# Patient Record
Sex: Female | Born: 1985 | Race: White | Hispanic: No | Marital: Married | Smoking: Never smoker
Health system: Southern US, Community
[De-identification: ages and names within clinical notes are randomized; demographics above are authoritative.]

## PROBLEM LIST (undated history)

## (undated) DIAGNOSIS — I10 Essential (primary) hypertension: Secondary | ICD-10-CM

## (undated) DIAGNOSIS — G43909 Migraine, unspecified, not intractable, without status migrainosus: Secondary | ICD-10-CM

## (undated) DIAGNOSIS — G4700 Insomnia, unspecified: Secondary | ICD-10-CM

## (undated) DIAGNOSIS — J189 Pneumonia, unspecified organism: Secondary | ICD-10-CM

## (undated) HISTORY — PX: WISDOM TOOTH EXTRACTION: SHX21

---

## 2004-12-06 ENCOUNTER — Emergency Department (HOSPITAL_COMMUNITY): Admission: EM | Admit: 2004-12-06 | Discharge: 2004-12-06 | Payer: Self-pay | Admitting: Emergency Medicine

## 2005-02-11 ENCOUNTER — Emergency Department (HOSPITAL_COMMUNITY): Admission: EM | Admit: 2005-02-11 | Discharge: 2005-02-11 | Payer: Self-pay | Admitting: Family Medicine

## 2006-05-29 ENCOUNTER — Emergency Department (HOSPITAL_COMMUNITY): Admission: EM | Admit: 2006-05-29 | Discharge: 2006-05-29 | Payer: Self-pay | Admitting: Family Medicine

## 2006-07-08 ENCOUNTER — Emergency Department (HOSPITAL_COMMUNITY): Admission: EM | Admit: 2006-07-08 | Discharge: 2006-07-08 | Payer: Self-pay | Admitting: Emergency Medicine

## 2006-09-30 ENCOUNTER — Emergency Department (HOSPITAL_COMMUNITY): Admission: EM | Admit: 2006-09-30 | Discharge: 2006-09-30 | Payer: Self-pay | Admitting: Emergency Medicine

## 2006-10-02 ENCOUNTER — Emergency Department (HOSPITAL_COMMUNITY): Admission: EM | Admit: 2006-10-02 | Discharge: 2006-10-02 | Payer: Self-pay | Admitting: Emergency Medicine
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
## 2015-06-24 ENCOUNTER — Ambulatory Visit
Admission: RE | Admit: 2015-06-24 | Discharge: 2015-06-24 | Disposition: A | Discharge status: Home / Self Care | Payer: Managed Care, Other (non HMO) | Source: Ambulatory Visit | Attending: Obstetrics and Gynecology | Admitting: Obstetrics and Gynecology

## 2015-06-24 ENCOUNTER — Other Ambulatory Visit: Payer: Self-pay | Admitting: Obstetrics and Gynecology

## 2015-06-24 DIAGNOSIS — N9489 Other specified conditions associated with female genital organs and menstrual cycle: Secondary | ICD-10-CM | POA: Diagnosis not present

## 2015-06-24 DIAGNOSIS — R102 Pelvic and perineal pain: Secondary | ICD-10-CM | POA: Insufficient documentation

## 2015-06-25 NOTE — H&P (Signed)
Chief Complaint:    Mikayla Zamora is a 29 y.o. female here for Pre Op Consulting .  Presents as followup for acute pelvic pain on left side that started 2 days ago with bright red vaginal bleeding. LMP 06/16/15 normal but with longer brown spotting. No nausea, vomiting. No diarrhea, constipation. Intermittently painful, always low level pain x2 days, cometimes much worse. Controlled with vicodin x1overnight, and motrin the rest of the time  Sexually active but not using contraception.   Ultrasound yesterday with complex adnexal mass measuring 7cm and large hypoechoic simple cyst measuring 9cm. Small complex free fluid in pelvis.   Present with husband Mikayla Zamora today.  Normal periods, not heavy, no hx of endometriosis-type sx. No prior pelvic surgeries.  Past Medical History:  has a past medical history of Abnormal uterine bleeding; Fracture of left foot; Hyperlipidemia; Hypertension; Plantar fibromatosis; and Pneumonia.  Past Surgical History:  has a past surgical history that includes Wisdom teeth removal. Family History: family history includes Arthritis in her other; Asthma in her other; Breast cancer in her maternal grandmother; Colon cancer in her paternal grandfather; Diabetes type II in her other; Hypertension in her father. Social History:  reports that she has never smoked. She does not have any smokeless tobacco history on file. She reports that she drinks alcohol. She reports that she does not use illicit drugs. OB/GYN History:  OB History    Gravida Para Term Preterm AB TAB SAB Ectopic Multiple Living   0 0 0 0 0 0 0 0 0 0       Allergies: has No Known Allergies. Medications:  Current Outpatient Prescriptions:  .  cholecalciferol, vitamin D3, 1,000 unit Chew, Take 2 tablets by mouth once daily., Disp: , Rfl:  .  diphenhydrAMINE (BENADRYL) 25 mg capsule, Take 50 mg by mouth nightly as needed for Itching or Allergies.  , Disp: , Rfl:  .  omeprazole (PRILOSEC) 20 MG DR  capsule, Take 1 capsule (20 mg total) by mouth once daily., Disp: 90 capsule, Rfl: 3 .  oxyCODONE-acetaminophen (PERCOCET) 5-325 mg tablet, Take 1 tablet by mouth every 6 (six) hours as needed for Pain., Disp: 30 tablet, Rfl: 0 .  PNV62/FA/OM3/DHA/EPA/FISH OIL (PRENATAL GUMMY ORAL), Take by mouth once daily., Disp: , Rfl:  .  propranolol (INDERAL LA) 120 MG 24 hr capsule, Take 1 capsule (120 mg total) by mouth once daily., Disp: 30 capsule, Rfl: 11 .  traZODone (DESYREL) 100 MG tablet, TAKE ONE TABLET BY MOUTH NIGHTLY AT BEDTIME, Disp: 90 tablet, Rfl: 1 .  traZODone (DESYREL) 50 MG tablet, Take 1 tablet (50 mg total) by mouth nightly as needed for Sleep. (Patient not taking: Reported on 06/24/2015 ), Disp: 60 tablet, Rfl: 11   Review of Systems: General:   No fatigue or weight loss Eyes:   No vision changes Ears:   No hearing difficulty Respiratory:                No cough or shortness of breath Pulmonary:   No asthma or shortness of breath Cardiovascular:        No chest pain, palpitations, dyspnea on exertion Gastrointestinal:          No abdominal bloating, chronic diarrhea, constipations, masses, pain or hematochezia Genitourinary:  No hematuria, dysuria, abnormal vaginal discharge, pelvic pain, Menometrorrhagia  Lymphatic:  No swollen lymph nodes Musculoskeletal: No muscle weakness Neurologic:  No extremity weakness, syncope, seizure disorder Psychiatric:  No history of depression, delusions or suicidal/homicidal ideation  Exam:   Vitals:   06/25/15 1733  BP: (!) 124/92  Pulse: 72   Body mass index is 39.51 kg/(m^2).  Exam mildly limited by habitus WDWN white female in NAD Lungs: CTA  CV : RRR without murmur   Breast: deferred Neck:  no thyromegaly Abdomen: soft , no mass, normal active bowel sounds,  non-tender, no rebound tenderness - no acute abdomen Skin: No rashes, ulcers or skin lesions noted. No excessive hirsutism or acne noted.  Neurological: Appears alert and  oriented and is a good historian. No gross abnormalities are noted. Psychological: Normal affect and mood. No signs of anxiety or depression noted.   Pelvic:   External genitalia: vulva /labia no lesions, Tanner stage 5  Urethra: no prolapse  Vagina: normal physiologic d/c  Cervix: no lesions, mild cervical motion tenderness    Uterus: normal size shape and contour, mildly-tender  Adnexa: no mass,  right non tender, significant left sided tendernss  Rectovaginal: external exam normal    Impression:   The primary encounter diagnosis was Adnexal mass. A diagnosis of Pelvic pain in female was also pertinent to this visit.    Plan:   - Left adnexal mass: Tumor markers today, including CA125, LDH, AFP. Likelihood of ovarian cancer low. Most likely intermittent torsion of large left simple cyst with possible hemorrhagic cyst. Pregnancy unlikely with neg UPT and neg beta.   She is stable but tender, and with known large cyst and unknown complex mass plan for surgical exploration.  Will plan for pelvic washings and pictures. All other procedures as indicated.  - Patient returns for a preoperative discussion regarding her plans to proceed with surgical treatment of her left adnexal pain by dx lap with pelvic washings procedure.  The patient and I discussed the technical aspects of the procedure including the potential for risks and complications.  These include but are not limited to the risk of infection requiring post-operative antibiotics or further procedures.  We talked about the risk of injury to adjacent organs including bladder, bowel, ureter, blood vessels or nerves.  We talked about the need to convert to an open incision.  We talked about the possible need for blood transfusion.  We talked about postop complications such as thromboembolic or cardiopulmonary complications.  All of her questions were answered.  Her preoperative exam was completed and the appropriate consents were signed.  She is scheduled to undergo this procedure in the near future.    Orders Placed This Encounter  Procedures  . CA 125, Serum (Serial) - [facility]  . AFP, Serum, Tumor Marker - [facility]  . LDH - [facility]    Prescott Truex EVANGELINE Zaiyden Strozier, MD   30 min spent with the patient, with >50% spent in counseling and clinical decision making

## 2015-06-26 ENCOUNTER — Ambulatory Visit: Payer: Managed Care, Other (non HMO) | Admitting: *Deleted

## 2015-06-26 ENCOUNTER — Ambulatory Visit
Admission: RE | Admit: 2015-06-26 | Discharge: 2015-06-26 | Disposition: A | Discharge status: Home / Self Care | Payer: Managed Care, Other (non HMO) | Source: Ambulatory Visit | Attending: Obstetrics and Gynecology | Admitting: Obstetrics and Gynecology

## 2015-06-26 ENCOUNTER — Encounter
Admission: RE | Disposition: A | Discharge status: Home / Self Care | Payer: Self-pay | Source: Ambulatory Visit | Attending: Obstetrics and Gynecology

## 2015-06-26 ENCOUNTER — Encounter: Payer: Self-pay | Admitting: *Deleted

## 2015-06-26 DIAGNOSIS — E785 Hyperlipidemia, unspecified: Secondary | ICD-10-CM | POA: Diagnosis not present

## 2015-06-26 DIAGNOSIS — N939 Abnormal uterine and vaginal bleeding, unspecified: Secondary | ICD-10-CM | POA: Diagnosis present

## 2015-06-26 DIAGNOSIS — I10 Essential (primary) hypertension: Secondary | ICD-10-CM | POA: Insufficient documentation

## 2015-06-26 DIAGNOSIS — R102 Pelvic and perineal pain: Secondary | ICD-10-CM | POA: Diagnosis not present

## 2015-06-26 DIAGNOSIS — Z803 Family history of malignant neoplasm of breast: Secondary | ICD-10-CM | POA: Insufficient documentation

## 2015-06-26 DIAGNOSIS — Z8249 Family history of ischemic heart disease and other diseases of the circulatory system: Secondary | ICD-10-CM | POA: Insufficient documentation

## 2015-06-26 DIAGNOSIS — Z8261 Family history of arthritis: Secondary | ICD-10-CM | POA: Insufficient documentation

## 2015-06-26 DIAGNOSIS — Z825 Family history of asthma and other chronic lower respiratory diseases: Secondary | ICD-10-CM | POA: Diagnosis not present

## 2015-06-26 DIAGNOSIS — K660 Peritoneal adhesions (postprocedural) (postinfection): Secondary | ICD-10-CM | POA: Insufficient documentation

## 2015-06-26 DIAGNOSIS — N809 Endometriosis, unspecified: Secondary | ICD-10-CM | POA: Diagnosis not present

## 2015-06-26 DIAGNOSIS — Z800 Family history of malignant neoplasm of digestive organs: Secondary | ICD-10-CM | POA: Diagnosis not present

## 2015-06-26 DIAGNOSIS — N838 Other noninflammatory disorders of ovary, fallopian tube and broad ligament: Secondary | ICD-10-CM | POA: Insufficient documentation

## 2015-06-26 DIAGNOSIS — K661 Hemoperitoneum: Secondary | ICD-10-CM | POA: Insufficient documentation

## 2015-06-26 DIAGNOSIS — Z833 Family history of diabetes mellitus: Secondary | ICD-10-CM | POA: Insufficient documentation

## 2015-06-26 DIAGNOSIS — Z79899 Other long term (current) drug therapy: Secondary | ICD-10-CM | POA: Diagnosis not present

## 2015-06-26 HISTORY — DX: Insomnia, unspecified: G47.00

## 2015-06-26 HISTORY — DX: Migraine, unspecified, not intractable, without status migrainosus: G43.909

## 2015-06-26 HISTORY — DX: Pneumonia, unspecified organism: J18.9

## 2015-06-26 HISTORY — PX: LAPAROSCOPY: SHX197

## 2015-06-26 HISTORY — DX: Essential (primary) hypertension: I10

## 2015-06-26 LAB — TYPE AND SCREEN
ABO/RH(D): O POS
ANTIBODY SCREEN: NEGATIVE

## 2015-06-26 LAB — CBC
HEMATOCRIT: 41 % (ref 35.0–47.0)
HEMOGLOBIN: 13.8 g/dL (ref 12.0–16.0)
MCH: 27.3 pg (ref 26.0–34.0)
MCHC: 33.6 g/dL (ref 32.0–36.0)
MCV: 81.2 fL (ref 80.0–100.0)
Platelets: 203 10*3/uL (ref 150–440)
RBC: 5.05 MIL/uL (ref 3.80–5.20)
RDW: 14 % (ref 11.5–14.5)
WBC: 7.9 10*3/uL (ref 3.6–11.0)

## 2015-06-26 LAB — BASIC METABOLIC PANEL
ANION GAP: 6 (ref 5–15)
BUN: 15 mg/dL (ref 6–20)
CHLORIDE: 106 mmol/L (ref 101–111)
CO2: 28 mmol/L (ref 22–32)
Calcium: 9.4 mg/dL (ref 8.9–10.3)
Creatinine, Ser: 0.81 mg/dL (ref 0.44–1.00)
GFR calc non Af Amer: >60 mL/min (ref >60)
Glucose, Bld: 100 mg/dL — ABNORMAL HIGH (ref 65–99)
POTASSIUM: 3.9 mmol/L (ref 3.5–5.1)
Sodium: 140 mmol/L (ref 135–145)

## 2015-06-26 LAB — PREGNANCY, URINE: Preg Test, Ur: NEGATIVE

## 2015-06-26 LAB — ABO/RH: ABO/RH(D): O POS

## 2015-06-26 SURGERY — LAPAROSCOPY, DIAGNOSTIC
Anesthesia: General | Wound class: Clean Contaminated

## 2015-06-26 MED ORDER — DEXAMETHASONE SODIUM PHOSPHATE 10 MG/ML IJ SOLN
INTRAMUSCULAR | Status: DC | PRN
  Administered 2015-06-26: 10 mg via INTRAVENOUS

## 2015-06-26 MED ORDER — HYDROCODONE-ACETAMINOPHEN 5-325 MG PO TABS: 1.0000 | ORAL_TABLET | ORAL | Status: DC | PRN

## 2015-06-26 MED ORDER — ROCURONIUM BROMIDE 100 MG/10ML IV SOLN
INTRAVENOUS | Status: DC | PRN
  Administered 2015-06-26: 20 mg via INTRAVENOUS
  Administered 2015-06-26 (×3): 10 mg via INTRAVENOUS

## 2015-06-26 MED ORDER — HYDROCODONE-ACETAMINOPHEN 5-325 MG PO TABS: 1.0000 | ORAL_TABLET | ORAL | Status: AC | PRN

## 2015-06-26 MED ORDER — LACTATED RINGERS IV SOLN
INTRAVENOUS | Status: DC
  Administered 2015-06-26 (×2): via INTRAVENOUS

## 2015-06-26 MED ORDER — FENTANYL CITRATE (PF) 100 MCG/2ML IJ SOLN
INTRAMUSCULAR | Status: DC | PRN
  Administered 2015-06-26 (×2): 50 ug via INTRAVENOUS
  Administered 2015-06-26: 250 ug via INTRAVENOUS

## 2015-06-26 MED ORDER — BUPIVACAINE HCL (PF) 0.5 % IJ SOLN
INTRAMUSCULAR | Status: AC
  Filled 2015-06-26: qty 30

## 2015-06-26 MED ORDER — DOCUSATE SODIUM 100 MG PO CAPS: 100.0000 mg | ORAL_CAPSULE | Freq: Two times a day (BID) | ORAL | Status: AC | PRN

## 2015-06-26 MED ORDER — SUCCINYLCHOLINE CHLORIDE 20 MG/ML IJ SOLN
INTRAMUSCULAR | Status: DC | PRN
  Administered 2015-06-26: 100 mg via INTRAVENOUS

## 2015-06-26 MED ORDER — METHYLENE BLUE 1 % INJ SOLN
INTRAMUSCULAR | Status: AC
  Filled 2015-06-26: qty 10

## 2015-06-26 MED ORDER — ONDANSETRON HCL 4 MG/2ML IJ SOLN: 4.0000 mg | Freq: Once | INTRAMUSCULAR | Status: DC | PRN

## 2015-06-26 MED ORDER — CEFAZOLIN SODIUM 1-5 GM-% IV SOLN
INTRAVENOUS | Status: DC | PRN
  Administered 2015-06-26: 3 g via INTRAVENOUS

## 2015-06-26 MED ORDER — ONDANSETRON HCL 4 MG/2ML IJ SOLN
INTRAMUSCULAR | Status: DC | PRN
  Administered 2015-06-26: 4 mg via INTRAVENOUS

## 2015-06-26 MED ORDER — ACETAMINOPHEN 10 MG/ML IV SOLN
INTRAVENOUS | Status: DC | PRN
Start: 2015-06-26 — End: 2015-06-26
  Administered 2015-06-26: 1000 mg via INTRAVENOUS

## 2015-06-26 MED ORDER — HYDROMORPHONE HCL 1 MG/ML IJ SOLN
INTRAMUSCULAR | Status: AC
  Administered 2015-06-26: 0.5 mg via INTRAVENOUS
  Filled 2015-06-26: qty 1

## 2015-06-26 MED ORDER — HYDROMORPHONE HCL 1 MG/ML IJ SOLN
0.2500 mg | INTRAMUSCULAR | Status: DC | PRN
  Administered 2015-06-26 (×2): 0.5 mg via INTRAVENOUS

## 2015-06-26 MED ORDER — KETOROLAC TROMETHAMINE 30 MG/ML IJ SOLN: 30.0000 mg | Freq: Once | INTRAMUSCULAR | Status: DC

## 2015-06-26 MED ORDER — LIDOCAINE HCL (CARDIAC) 20 MG/ML IV SOLN
INTRAVENOUS | Status: DC | PRN
  Administered 2015-06-26: 100 mg via INTRAVENOUS

## 2015-06-26 MED ORDER — IBUPROFEN 800 MG PO TABS: 800.0000 mg | ORAL_TABLET | Freq: Three times a day (TID) | ORAL | Status: AC | PRN

## 2015-06-26 MED ORDER — PROPOFOL 10 MG/ML IV BOLUS
INTRAVENOUS | Status: DC | PRN
  Administered 2015-06-26: 200 mg via INTRAVENOUS
  Administered 2015-06-26: 50 mg via INTRAVENOUS

## 2015-06-26 MED ORDER — ONDANSETRON 4 MG PO TBDP
4.0000 mg | ORAL_TABLET | Freq: Four times a day (QID) | ORAL | Status: AC | PRN
Start: 2015-06-26 — End: ?

## 2015-06-26 MED ORDER — NEOSTIGMINE METHYLSULFATE 10 MG/10ML IV SOLN
INTRAVENOUS | Status: DC | PRN
  Administered 2015-06-26: 4 mg via INTRAVENOUS

## 2015-06-26 MED ORDER — BUPIVACAINE HCL 0.5 % IJ SOLN
INTRAMUSCULAR | Status: DC | PRN
  Administered 2015-06-26: 30 mL

## 2015-06-26 MED ORDER — ACETAMINOPHEN 10 MG/ML IV SOLN
INTRAVENOUS | Status: AC
  Filled 2015-06-26: qty 100

## 2015-06-26 MED ORDER — MIDAZOLAM HCL 2 MG/2ML IJ SOLN
INTRAMUSCULAR | Status: DC | PRN
  Administered 2015-06-26: 2 mg via INTRAVENOUS

## 2015-06-26 MED ORDER — DEXMEDETOMIDINE HCL 200 MCG/2ML IV SOLN
INTRAVENOUS | Status: DC | PRN
  Administered 2015-06-26: 8 ug via INTRAVENOUS

## 2015-06-26 MED ORDER — GLYCOPYRROLATE 0.2 MG/ML IJ SOLN
INTRAMUSCULAR | Status: DC | PRN
  Administered 2015-06-26: 0.6 mg via INTRAVENOUS

## 2015-06-26 SURGICAL SUPPLY — 44 items
BAG URO DRAIN 2000ML W/SPOUT (MISCELLANEOUS) ×1 IMPLANT
BLADE SURG SZ11 CARB STEEL (BLADE) ×2 IMPLANT
CATH FOLEY 2WAY  5CC 16FR (CATHETERS) ×1
CATH FOLEY 2WAY 5CC 16FR (CATHETERS) ×1
CATH ROBINSON RED A/P 16FR (CATHETERS) ×2 IMPLANT
CATH URTH 16FR FL 2W BLN LF (CATHETERS) ×1 IMPLANT
CHLORAPREP W/TINT 26ML (MISCELLANEOUS) ×2 IMPLANT
DEFOGGER SCOPE WARMER CLEARIFY (MISCELLANEOUS) ×1 IMPLANT
DRSG TEGADERM 2-3/8X2-3/4 SM (GAUZE/BANDAGES/DRESSINGS) ×2 IMPLANT
ENDOPOUCH RETRIEVER 10 (MISCELLANEOUS) ×1 IMPLANT
GAUZE SPONGE NON-WVN 2X2 STRL (MISCELLANEOUS) ×1 IMPLANT
GLOVE BIO SURGEON STRL SZ 6.5 (GLOVE) ×4 IMPLANT
GLOVE INDICATOR 7.0 STRL GRN (GLOVE) ×2 IMPLANT
GOWN STRL REUS W/ TWL LRG LVL3 (GOWN DISPOSABLE) ×2 IMPLANT
GOWN STRL REUS W/TWL LRG LVL3 (GOWN DISPOSABLE) ×4
GRASPER SUT TROCAR 14GX15 (MISCELLANEOUS) ×1 IMPLANT
IRRIGATION STRYKERFLOW (MISCELLANEOUS) ×1 IMPLANT
IRRIGATOR STRYKERFLOW (MISCELLANEOUS) ×2
IV SOD CHL 0.9% 1000ML (IV SOLUTION) ×2 IMPLANT
KIT RM TURNOVER CYSTO AR (KITS) ×2 IMPLANT
LABEL OR SOLS (LABEL) ×2 IMPLANT
LIGASURE MARYLAND LAP STAND (ELECTROSURGICAL) ×1 IMPLANT
LIQUID BAND (GAUZE/BANDAGES/DRESSINGS) ×2 IMPLANT
NEEDLE INSUFFLATION 150MM (ENDOMECHANICALS) ×1 IMPLANT
NS IRRIG 500ML POUR BTL (IV SOLUTION) ×2 IMPLANT
PACK GYN LAPAROSCOPIC (MISCELLANEOUS) ×2 IMPLANT
PAD OB MATERNITY 4.3X12.25 (PERSONAL CARE ITEMS) ×2 IMPLANT
PAD PREP 24X41 OB/GYN DISP (PERSONAL CARE ITEMS) ×2 IMPLANT
PENCIL ELECTRO HAND CTR (MISCELLANEOUS) ×1 IMPLANT
SCISSORS METZENBAUM CVD 33 (INSTRUMENTS) ×3 IMPLANT
SHEARS HARMONIC ACE PLUS 36CM (ENDOMECHANICALS) ×1 IMPLANT
SLEEVE ENDOPATH XCEL 5M (ENDOMECHANICALS) ×3 IMPLANT
SPONGE VERSALON 2X2 STRL (MISCELLANEOUS) ×2
STRIP CLOSURE SKIN 1/4X4 (GAUZE/BANDAGES/DRESSINGS) ×2 IMPLANT
SUT MNCRL AB 4-0 PS2 18 (SUTURE) ×3 IMPLANT
SUT VIC AB 0 UR5 27 (SUTURE) ×1 IMPLANT
SUT VIC AB 2-0 UR6 27 (SUTURE) ×2 IMPLANT
SUT VIC AB 4-0 SH 27 (SUTURE) ×2
SUT VIC AB 4-0 SH 27XANBCTRL (SUTURE) ×1 IMPLANT
SWABSTK COMLB BENZOIN TINCTURE (MISCELLANEOUS) ×2 IMPLANT
TROCAR ENDO BLADELESS 11MM (ENDOMECHANICALS) ×2 IMPLANT
TROCAR XCEL NON-BLD 5MMX100MML (ENDOMECHANICALS) ×4 IMPLANT
TROCAR XCEL UNIV SLVE 11M 100M (ENDOMECHANICALS) ×2 IMPLANT
TUBING INSUFFLATOR HI FLOW (MISCELLANEOUS) ×2 IMPLANT

## 2015-06-26 NOTE — Op Note (Addendum)
[facility] PROCEDURE DATE: 06/26/2015  PREOPERATIVE DIAGNOSIS: Acute pelvic pain, left adnexal mass, heavy abnormal uterine bleeding POSTOPERATIVE DIAGNOSIS:  left paratubal cyst, hemoperitoneum, pelvic adhesions PROCEDURE: Diagnostic laparoscopy, Laparoscopic left salpingectomy and removal of left-sided paratubal cyst, evacuation of hemoperitoneum, adhesiolysis  SURGEON:  Dr. Christeen Douglas ASSISTANT: Dr. Maryann Conners  ANESTHESIOLOGIST: Linward Natal, MD Anesthesiologist: Linward Natal, MD CRNA: Junious Silk, CRNA; Lily Kocher, CRNA; Paulette Blanch, CRNA  INDICATIONS: 29 y.o. G0 here with the preoperative diagnoses as listed above.  Please refer to preoperative notes for more details. Patient was counseled regarding need for laparoscopic salpingectomy. Risks of surgery including bleeding which may require transfusion or reoperation, infection, injury to bowel or other surrounding organs, need for additional procedures including laparotomy and other postoperative/anesthesia complications were explained to patient.  Written informed consent was obtained.  FINDINGS:  Normal proliferative endometrial lining, with both ostia visualized. No lesions or masses; no septa noted.Total amount of hemoperitoneum estimated to be about 300 of blood and clots.  Left large simple paratubal cyst surrounded by significant amounts of organized clot with large amount of hemoperitoneum, but no active bleeding found. The cyst, which was in the fimbria, was unable to be separated from the fallopian tube. Left pelvic adhesions between fallopian tube/cyst complex and left rectosigmoid colon. Small normal appearing uterus, normal right fallopian tube, right ovary and left ovary.  ANESTHESIA: General INTRAVENOUS FLUIDS: 1700 ml ESTIMATED BLOOD LOSS: 50 ml URINE OUTPUT: 200 ml SPECIMENS: Left fallopian tube, left cyst, pelvic washings and endometrial biopsy COMPLICATIONS: None immediate  PROCEDURE IN DETAIL:  The patient was  taken to the operating room where general anesthesia was administered and was found to be adequate.  She was placed in the dorsal lithotomy position, and was prepped and draped in a sterile manner.   D&C:: Her bladder was catheterized for an estimated amount of clear, yellow urine. A weighed speculum was then placed in the patient's vagina and a single tooth tenaculum was applied to the anterior lip of the cervix.  Her cervix was serially dilated to 15 [place] using Hanks dilators. A hysteroscopy was placed with findings above. A sharp curettage was introducedand gentle biopsy taken. The tenaculum was removed from the anterior lip of the cervix and the vaginal speculum was removed after applying silver nitrate for good hemostasis. A sponge stick was placed in the vagina for uterine manipulation.  Laparoscopy:: Attention was turned to the abdomen where an umbilical incision was made with the scalpel.  A veress needle was placed and the abdomen was insufflated to . The Optiview 5-mm trocar and sleeve were then advanced without difficulty. Survey of the entry site was noted to be without damage. A survey of the patient's pelvis and abdomen revealed the findings above.  A 11-mm port was placed on the right under direct visualization, and a 5-mm right lower quadrant port was then placed under direct visualization.  The Nezhat suction irrigator was then used to suction the hemoperitoneum and irrigate the pelvis.  A survey of the pelvis was unable to find the ureter peristalsis secondary to colonic adhesions to the clot, but the entire course of the ureter was significantly away from the surgical field. Attention was then turned to the left fallopian tube and ovary. The left tube was separated from the underlying mesosalpinx and uterine attachment using the Ligasure instrument.  Good hemostasis was noted.  The specimen was placed in an EndoCatch bag and removed from the abdomen intact.  The abdomen was  desufflated, and all instruments were removed.  The fascial incision of the 10-mm site was reapproximated with a 0 Vicryl figure-of-eight stitch; and all skin incisions were closed with 4-0 Vicryl and Dermabond.   The patient tolerated the procedure well.  All instruments, needles, and sponge counts were correct x 2. The patient was taken to the recovery room in stable condition.   Cline CoolsBethany E. Juanna Pudlo, MD, MPH

## 2015-06-26 NOTE — Discharge Instructions (Signed)
Laparoscopic Tubal Removal  Discharge Instructions  Laparoscopic tubal removal is a procedure that removes the fallopian tube containing the cyst  RISKS AND COMPLICATIONS   Infection.  Bleeding.  Injury to surrounding organs.  Anesthetic side effects.  Failure of the procedure.  Risks of future ectopic pregnancy on the other side PROCEDURE   You may be given a medicine to help you relax (sedative) before the procedure. You will be given a medicine to make you sleep (general anesthetic) during the procedure.  A tube will be put down your throat to help your breath while under general anesthesia.  Two small cuts (incisions) are made in the lower abdominal area and one incision is made near the belly button.  Your abdominal area will be inflated with a safe gas (carbon dioxide). This helps give the surgeon room to operate, visualize, and helps the surgeon avoid other organs.  A thin, lighted tube (laparoscope) with a camera attached is inserted into your abdomen through the incision near the belly button. Other small instruments are also inserted through the other abdominal incisions.  The fallopian tube is located and are removed.  After the fallopian tube is removed, the gas is released from the abdomen.  The incisions will be closed with stitches (sutures), and Dermabond. A bandage may be placed over the incisions. AFTER THE PROCEDURE   You will also have some mild abdominal discomfort for 3-7 days. You will be given pain medicine to ease any discomfort.  As long as there are no problems, you may be allowed to go home. Someone will need to drive you home and be with you for at least 24 hours once home.  You may have some mild discomfort in the throat. This is from the tube placed in your throat while you were sleeping.  You may experience discomfort in the shoulder area from some trapped air between the liver and diaphragm. This sensation is normal and will slowly go away on  its own. HOME CARE INSTRUCTIONS   Take all medicines as directed.  Only take over-the-counter or prescription medicines for pain, discomfort, or fever as directed by your caregiver.  Resume daily activities as directed.  Showers are preferred over baths.  You may resume sexual activities in 2 weeks or when you stop spotting  Do not drive while taking narcotics. SEEK MEDICAL CARE IF: .  There is increasing abdominal pain.  You feel lightheaded or faint.  You have the chills.  You have an oral temperature above 102 F (38.9 C).  There is pus-like (purulent) drainage from any of the wounds.  You are unable to pass gas or have a bowel movement.  You feel sick to your stomach (nauseous) or throw up (vomit). MAKE SURE YOU:   Understand these instructions.  Will watch your condition.  Will get help right away if you are not doing well or get worse.  ExitCare Patient Information 2013 [employer] [place]    AMBULATORY SURGERY  DISCHARGE INSTRUCTIONS   1) The drugs that you were given will stay in your system until tomorrow so for the next 24 hours you should not:  A) Drive an automobile B) Make any legal decisions C) Drink any alcoholic beverage   2) You may resume regular meals tomorrow.  Today it is better to start with liquids and gradually work up to solid foods.  You may eat anything you prefer, but it is better to start with liquids, then soup and crackers, and gradually work up  to solid foods.   3) Please notify your doctor immediately if you have any unusual bleeding, trouble breathing, redness and pain at the surgery site, drainage, fever, or pain not relieved by medication.    4) Additional Instructions:        Please contact your physician with any problems or Same Day Surgery at [phone], Monday through Friday 6 am to 4 pm, or [place] at [facility] number at [phone].

## 2015-06-26 NOTE — Transfer of Care (Signed)
Immediate Anesthesia Transfer of Care Note  Patient: Mikayla Zamora  Procedure(s) Performed: Procedure(s): LAPAROSCOPY DIAGNOSTIC  WITH PELVIC WASHINGS/HYSTEROSCOPY/LEFT SALPINGECTOMY, lysis of adhesions, evacuation of hemoperitoneum (N/A)  Patient Location: PACU  Anesthesia Type:General  Level of Consciousness: awake, alert  and oriented  Airway & Oxygen Therapy: Patient Spontanous Breathing and Patient connected to face mask oxygen  Post-op Assessment: Report given to RN and Post -op Vital signs reviewed and stable  Post vital signs: Reviewed and stable  Last Vitals:  Filed Vitals:   06/26/15 0925  BP: 139/85  Pulse: 80  Temp: 36.6 C  Resp: 16    Complications: No apparent anesthesia complications

## 2015-06-26 NOTE — Anesthesia Preprocedure Evaluation (Addendum)
Anesthesia Evaluation  Patient identified by MRN, date of birth, ID band Patient awake    Reviewed: Allergy & Precautions, NPO status , Patient's Chart, lab work & pertinent test results  Airway Mallampati: I  TM Distance: >3 FB Neck ROM: Full    Dental  (+) Teeth Intact   Pulmonary    Pulmonary exam normal        Cardiovascular Exercise Tolerance: Good (-) hypertensionNormal cardiovascular exam     Neuro/Psych  Headaches,    GI/Hepatic GERD  Medicated and Controlled,  Endo/Other    Renal/GU      Musculoskeletal   Abdominal (+) + obese,   Peds  Hematology   Anesthesia Other Findings   Reproductive/Obstetrics                            Anesthesia Physical Anesthesia Plan  ASA: II and emergent  Anesthesia Plan: General   Post-op Pain Management:    Induction: Intravenous  Airway Management Planned: Oral ETT  Additional Equipment:   Intra-op Plan:   Post-operative Plan: Extubation in OR  Informed Consent: I have reviewed the patients History and Physical, chart, labs and discussed the procedure including the risks, benefits and alternatives for the proposed anesthesia with the patient or authorized representative who has indicated his/her understanding and acceptance.     Plan Discussed with:   Anesthesia Plan Comments: (On propranolol for migraine prophylaxis. Uneventful wisdom teeth extractions. Beta-HCG negative. Three day hx of pelvic pain and bleeding.  R/O ovarian torsion.)        Anesthesia Quick Evaluation

## 2015-06-26 NOTE — Interval H&P Note (Signed)
History and Physical Interval Note:  06/26/2015 11:04 AM  Mikayla Zamora  has presented today for surgery, with the diagnosis of acute pelvic pain, vaginal bleeding, and ADNEXAL MASS, vaginal pain.  The various methods of treatment have been discussed with the patient and family. After consideration of risks, benefits and other options for treatment, the patient has consented to dilation and curettage, hysteroscopy  Procedure(s): LAPAROSCOPY DIAGNOSTIC  WITH PELVIC WASHINGS (N/A) possible salpingectomy and possible oophorectomy, as a surgical intervention .  The patient's history has been reviewed, patient examined, no change in status, stable for surgery.  I have reviewed the patient's chart and labs.  Questions were answered to the patient's satisfaction.    I originally did not plan on D&C, hysteroscopy, but given patient's increasing vaginal bleeding overnight with significant pain, I have consented her in the preop area for hysteroscopy with gentle D&C.  Christeen DouglasBEASLEY, Patton Swisher

## 2015-06-26 NOTE — Transfer of Care (Incomplete)
Immediate Anesthesia Transfer of Care Note  Patient: Mikayla Zamora  Procedure(s) Performed: Procedure(s): LAPAROSCOPY DIAGNOSTIC  WITH PELVIC WASHINGS/HYSTEROSCOPY/LEFT SALPINGECTOMY, lysis of adhesions, evacuation of hemoperitoneum (N/A)  Patient Location: {PLACES; ANE POST:19477::"PACU"}  Anesthesia Type:{PROCEDURES; ANE POST ANESTHESIA TYPE:19480}  Level of Consciousness: {FINDINGS; ANE POST LEVEL OF CONSCIOUSNESS:19484}  Airway & Oxygen Therapy: {Exam; oxygen device:30095}  Post-op Assessment: {ASSESSMENT;POST-OP XBJYNW:29562}REPORT:18741}  Post vital signs: {DESC; ANE POST ZHYQMV:78469}VITALS:19483}  Last Vitals:  Filed Vitals:   06/26/15 0925  BP: 139/85  Pulse: 80  Temp: 36.6 C  Resp: 16    Complications: {FINDINGS; ANE POST COMPLICATIONS:19485}

## 2015-06-26 NOTE — Anesthesia Postprocedure Evaluation (Signed)
Anesthesia Post Note  Patient: Jerilynn SomRebecca R Limones  Procedure(s) Performed: Procedure(s) (LRB): LAPAROSCOPY DIAGNOSTIC  WITH PELVIC WASHINGS/HYSTEROSCOPY/LEFT SALPINGECTOMY, lysis of adhesions, evacuation of hemoperitoneum (N/A)  Patient location during evaluation: PACU Anesthesia Type: General Level of consciousness: awake Pain management: pain level controlled Vital Signs Assessment: post-procedure vital signs reviewed and stable Respiratory status: spontaneous breathing Cardiovascular status: blood pressure returned to baseline Postop Assessment: no headache Anesthetic complications: no    Last Vitals:  Filed Vitals:   06/26/15 0925 06/26/15 1443  BP: 139/85 109/46  Pulse: 80 73  Temp: 36.6 C 36.4 C  Resp: 16 14    Last Pain:  Filed Vitals:   06/26/15 1444  PainSc: 4                  Jarryn Altland M

## 2015-06-29 ENCOUNTER — Encounter: Payer: Self-pay | Admitting: Obstetrics and Gynecology

## 2015-06-30 LAB — CYTOLOGY - NON PAP

## 2015-07-02 LAB — SURGICAL PATHOLOGY

## 2015-07-20 NOTE — Op Note (Signed)
Operative note Addendum:  In the original op note for this procedure, I neglected to mention that I did perform and hysteroscopy with dilation and curettage prior to the dx laparoscopy part of the procedure.

## 2017-02-25 IMAGING — US US PELVIS COMPLETE
1 series · 13 of 25 positions shown · non-contrast
Comparison: None

CLINICAL DATA: Acute pelvic pain for 1 day. Pain on the left side.
Vaginal bleeding.

EXAM:
TRANSABDOMINAL AND TRANSVAGINAL ULTRASOUND OF PELVIS
TECHNIQUE: Both transabdominal and transvaginal ultrasound examinations of the
pelvis were performed. Transabdominal technique was performed for
global imaging of the pelvis including uterus, ovaries, adnexal
regions, and pelvic cul-de-sac. It was necessary to proceed with
endovaginal exam following the transabdominal exam to visualize the
endometrium and ovaries..

[Series 1: us pelvis complete · 0.21mm/px · 13 of 121 slices shown]
[im 1/121]
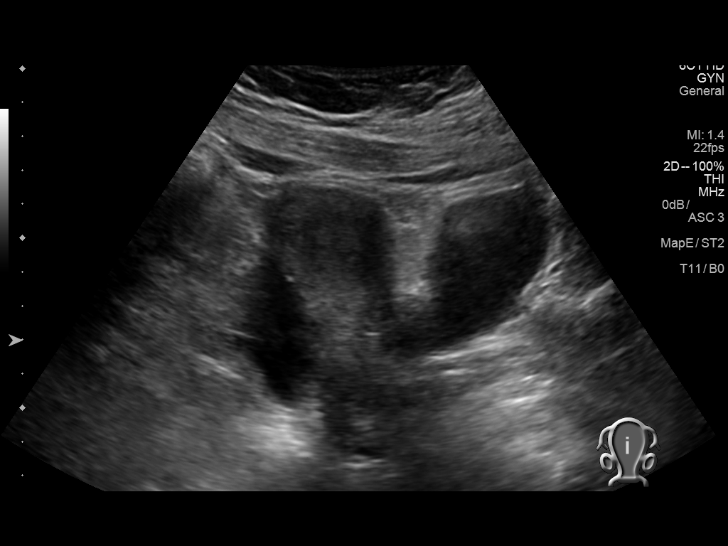
[im 11/121]
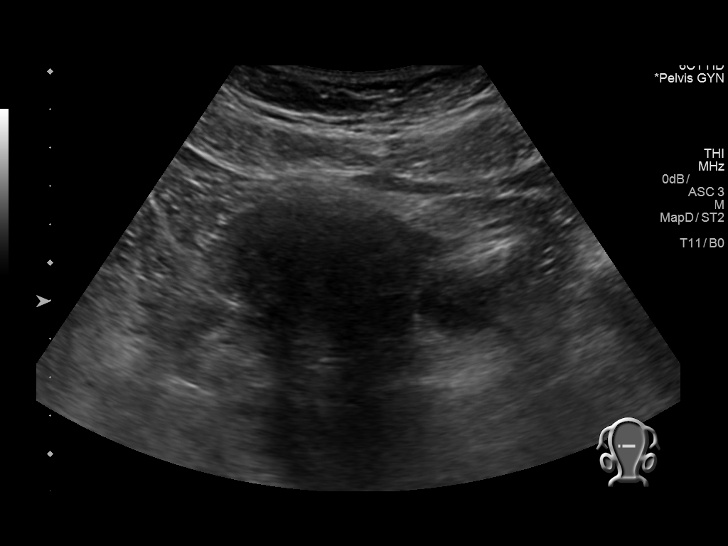
[im 21/121]
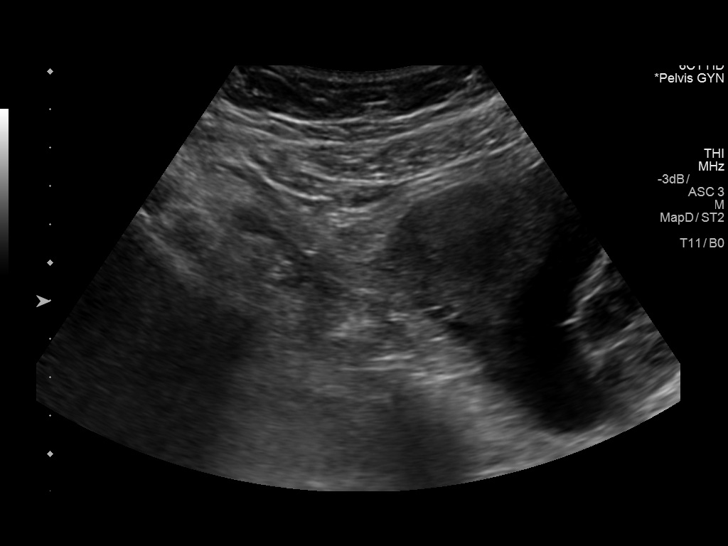
[im 31/121]
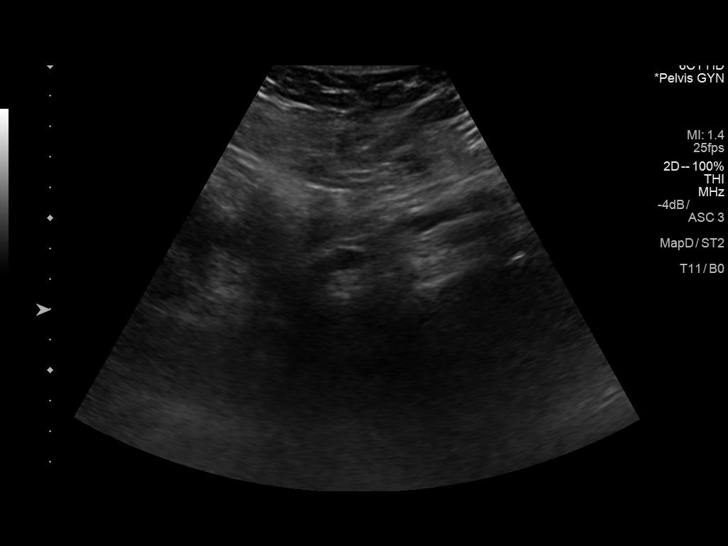
[im 41/121]
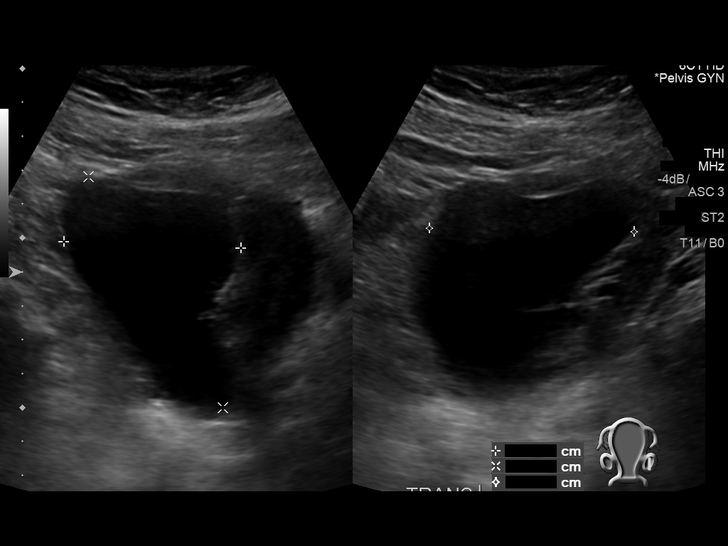
[im 51/121]
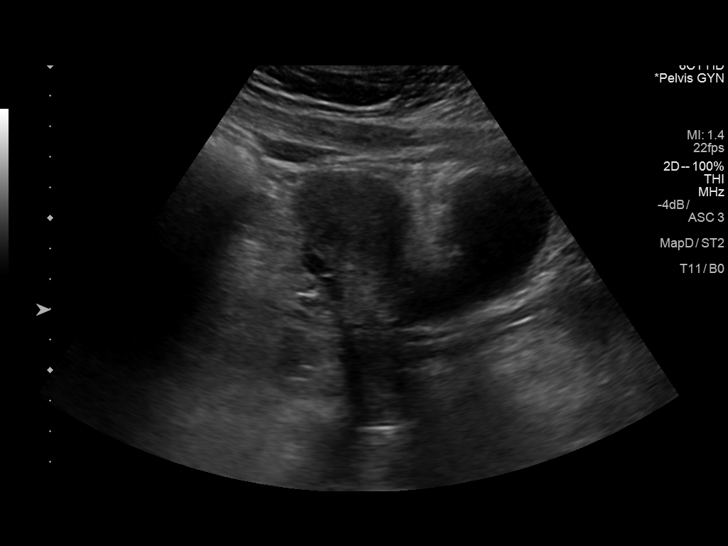
[im 61/121]
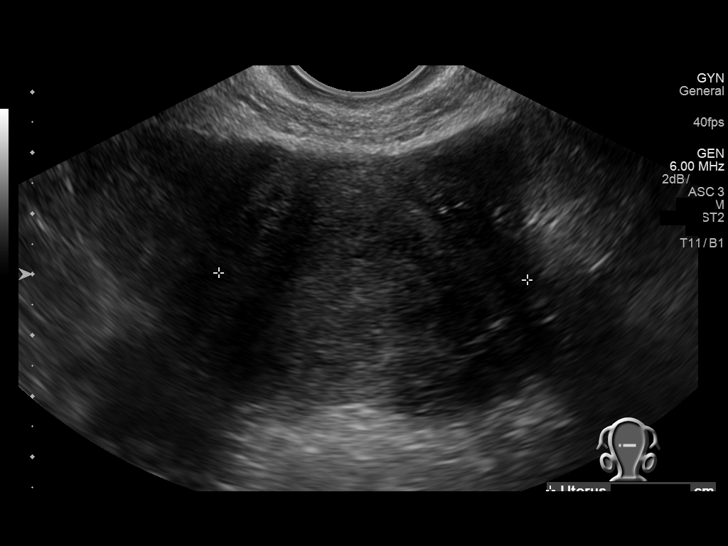
[im 71/121]
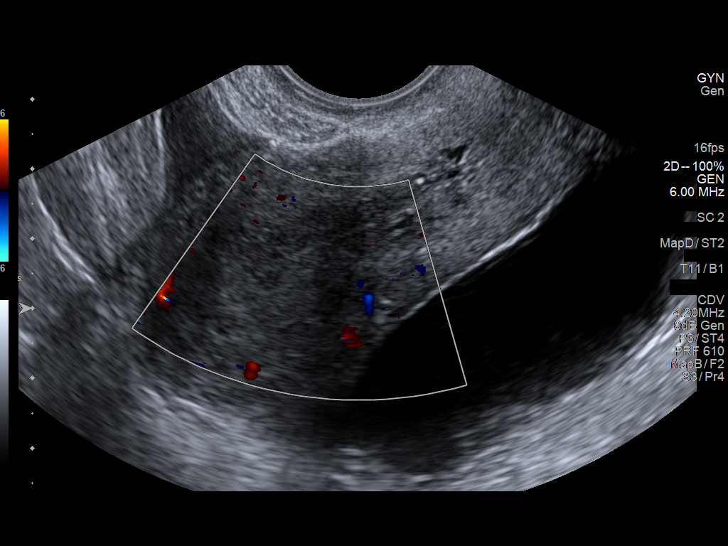
[im 81/121]
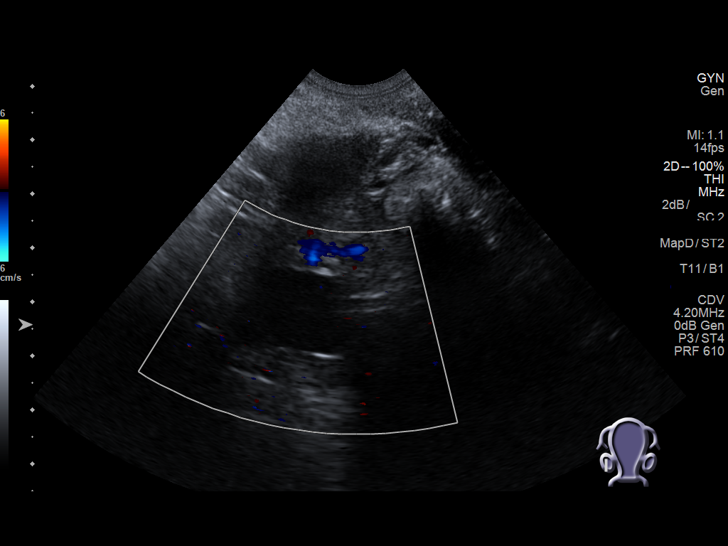
[im 91/121]
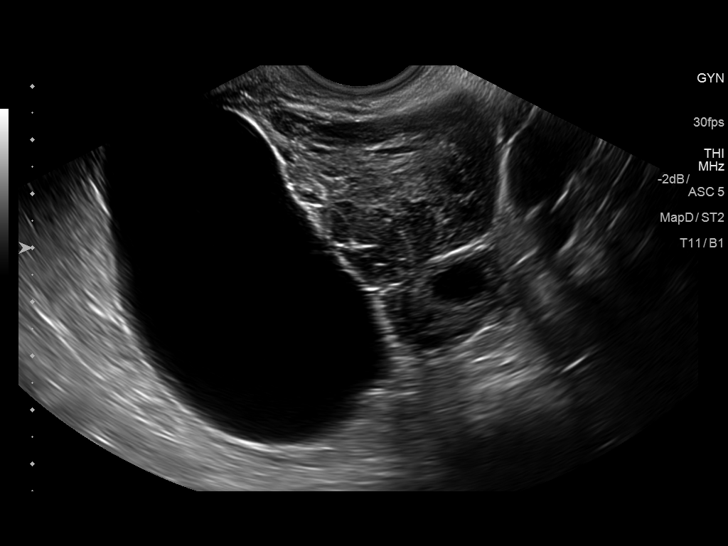
[im 101/121]
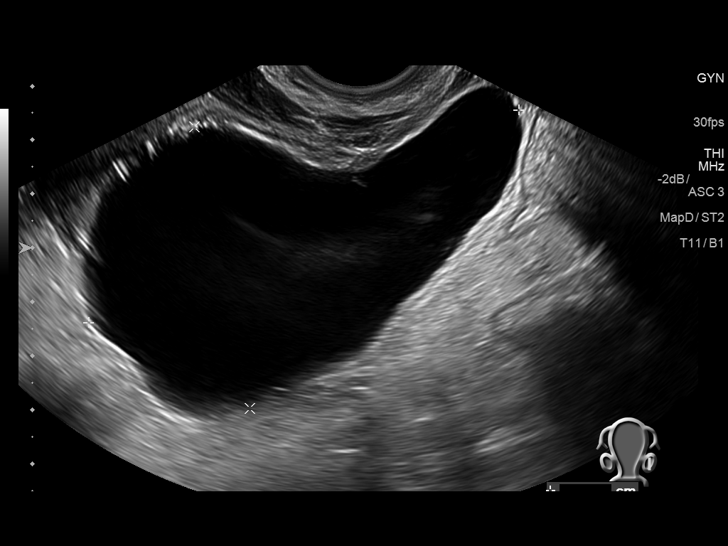
[im 111/121]
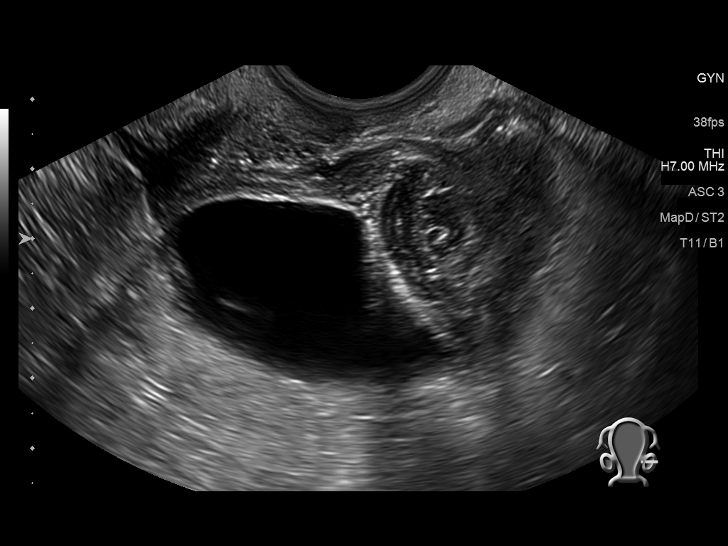
[im 121/121]
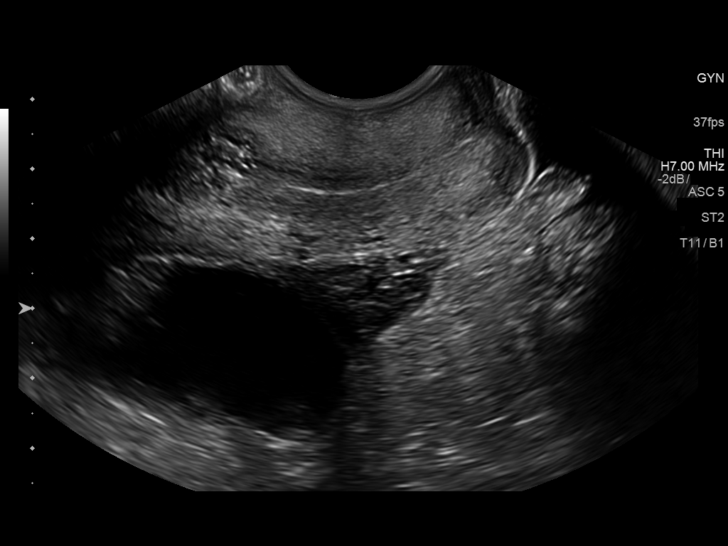

[13 of 25 positions shown; findings below may reference images not displayed]

FINDINGS: Uterus

Measurements: 8.2 x 3.5 x 5.3 cm. No fibroids or other mass
visualized.

Endometrium

Thickness: 0.6 cm.  No focal abnormality visualized.

Right ovary

Measurements: 2.9 x 2.3 x 2.9 cm. Normal appearance/no adnexal mass.

Left ovary

There is a large heterogeneous structure that appears to be
associated with the left ovary. This structure measures 7.4 x 6.5 x
8.5 cm. There is vascular flow within this large heterogeneous
structure. Adjacent to this large heterogeneous structure, there is
a large anechoic cyst that measures 8.9 x 5.3 x 5.1 cm.

Other findings

Small amount of complex free fluid in the pelvis.
IMPRESSION: Large heterogeneous structure associated with the left ovary/
adnexa. Findings are concerning for a large inflammatory or possibly
hemorrhagic lesion. Neoplastic process cannot be excluded based on
the imaging characteristics. There is a large simple cyst adjacent
to this left ovarian lesion, measuring up to 8.9 cm.

Small amount of complex fluid in the pelvis that could represent
blood products.

Normal appearance of the uterus and right ovary.
